# Patient Record
Sex: Male | Born: 1980 | Hispanic: Yes | Marital: Single | Smoking: Never smoker
Health system: Southern US, Community
[De-identification: ages and names within clinical notes are randomized; demographics above are authoritative.]

---

## 2016-01-20 ENCOUNTER — Emergency Department (HOSPITAL_BASED_OUTPATIENT_CLINIC_OR_DEPARTMENT_OTHER): Payer: Worker's Compensation

## 2016-01-20 ENCOUNTER — Emergency Department (HOSPITAL_BASED_OUTPATIENT_CLINIC_OR_DEPARTMENT_OTHER)
Admission: EM | Admit: 2016-01-20 | Discharge: 2016-01-20 | Disposition: A | Discharge status: Home / Self Care | Payer: Worker's Compensation | Attending: Emergency Medicine | Admitting: Emergency Medicine

## 2016-01-20 ENCOUNTER — Encounter (HOSPITAL_BASED_OUTPATIENT_CLINIC_OR_DEPARTMENT_OTHER): Payer: Self-pay

## 2016-01-20 DIAGNOSIS — X58XXXA Exposure to other specified factors, initial encounter: Secondary | ICD-10-CM | POA: Insufficient documentation

## 2016-01-20 DIAGNOSIS — S76219A Strain of adductor muscle, fascia and tendon of unspecified thigh, initial encounter: Secondary | ICD-10-CM

## 2016-01-20 DIAGNOSIS — S39011A Strain of muscle, fascia and tendon of abdomen, initial encounter: Secondary | ICD-10-CM | POA: Diagnosis not present

## 2016-01-20 DIAGNOSIS — Y939 Activity, unspecified: Secondary | ICD-10-CM | POA: Diagnosis not present

## 2016-01-20 DIAGNOSIS — Y929 Unspecified place or not applicable: Secondary | ICD-10-CM | POA: Insufficient documentation

## 2016-01-20 DIAGNOSIS — Y990 Civilian activity done for income or pay: Secondary | ICD-10-CM | POA: Insufficient documentation

## 2016-01-20 DIAGNOSIS — S3991XA Unspecified injury of abdomen, initial encounter: Secondary | ICD-10-CM | POA: Diagnosis present

## 2016-01-20 DIAGNOSIS — N50811 Right testicular pain: Secondary | ICD-10-CM

## 2016-01-20 LAB — URINE MICROSCOPIC-ADD ON: Squamous Epithelial / LPF: NONE SEEN

## 2016-01-20 LAB — URINALYSIS, ROUTINE W REFLEX MICROSCOPIC
BILIRUBIN URINE: NEGATIVE
GLUCOSE, UA: NEGATIVE mg/dL
KETONES UR: NEGATIVE mg/dL
LEUKOCYTES UA: NEGATIVE
Nitrite: NEGATIVE
PH: 6 (ref 5.0–8.0)
Protein, ur: NEGATIVE mg/dL
SPECIFIC GRAVITY, URINE: 1.009 (ref 1.005–1.030)

## 2016-01-20 MED ORDER — NAPROXEN 500 MG PO TABS: 500.0000 mg | ORAL_TABLET | Freq: Two times a day (BID) | ORAL | Status: AC

## 2016-01-20 MED ORDER — ONDANSETRON HCL 4 MG/2ML IJ SOLN
INTRAMUSCULAR | Status: AC
  Filled 2016-01-20: qty 2

## 2016-01-20 NOTE — ED Provider Notes (Signed)
CSN: [id number]     Arrival date & time 01/20/16  1759 History  By signing my name below, I, Freida Busman, attest that this documentation has been prepared under the direction and in the presence of Rolan Bucco, MD . Electronically Signed: Freida Busman, Scribe. 01/20/2016. 7:59 PM.  Chief Complaint  Patient presents with  . Groin Injury   The history is provided by the patient. No language interpreter was used.     HPI Comments:  Mitchell Christian is a 35 y.o. male who presents to the Emergency Department complaining of an injury to his groin which occurred 4 days ago. Pt states he sat down improperly on a frame and injured his right testicle. He reports moderate pain to the site. He also reports mild relief when in a squatted position. Pt notes associated lower abdominal pain x 3 days. He denies nausea, vomiting, dysuria, genital masses, and hematuria.  History reviewed. No pertinent past medical history. History reviewed. No pertinent past surgical history. No family history on file. Social History  Substance Use Topics  . Smoking status: Never Smoker   . Smokeless tobacco: None  . Alcohol Use: Yes     Comment: occ    Review of Systems  Gastrointestinal: Positive for abdominal pain. Negative for nausea and vomiting.  Genitourinary: Positive for testicular pain. Negative for dysuria and hematuria.  All other systems reviewed and are negative.  Allergies  Review of patient's allergies indicates no known allergies.  Home Medications   Prior to Admission medications   Medication Sig Start Date End Date Taking? Authorizing Provider  naproxen (NAPROSYN) 500 MG tablet Take 1 tablet (500 mg total) by mouth 2 (two) times daily. 01/20/16   Rolan Bucco, MD   BP 116/80 mmHg  Pulse 63  Temp(Src) 98.7 F (37.1 C) (Oral)  Resp 16  Ht 6' (1.829 m)  Wt 195 lb (88.451 kg)  BMI 26.44 kg/m2  SpO2 100% Physical Exam  Constitutional: He is oriented to person, place, and time. He  appears well-developed and well-nourished.  HENT:  Head: Normocephalic and atraumatic.  Eyes: EOM are normal.  Neck: Normal range of motion.  Cardiovascular: Normal rate, regular rhythm, normal heart sounds and intact distal pulses.   Pulmonary/Chest: Effort normal and breath sounds normal. No respiratory distress.  Abdominal: Soft. He exhibits no distension. There is no tenderness. There is no CVA tenderness.  Genitourinary: Penis normal. Uncircumcised.   Tenderness to left testicle  No swelling or abnormal lie  No hernia Chaperone (scribe) was present for exam which was performed with no discomfort or complications.   Musculoskeletal: Normal range of motion.  Neurological: He is alert and oriented to person, place, and time.  Skin: Skin is warm and dry.  Psychiatric: He has a normal mood and affect. Judgment normal.  Nursing note and vitals reviewed.   ED Course  Procedures DIAGNOSTIC STUDIES:  Oxygen Saturation is 98% on RA, normal by my interpretation.    COORDINATION OF CARE:  7:44 PM Will order scrotal US. Discussed treatment plan with pt at bedside and pt agreed to plan.  Labs Review  Results for orders placed or performed during the hospital encounter of 01/20/16  Urinalysis, Routine w reflex microscopic  Result Value Ref Range   Color, Urine YELLOW YELLOW   APPearance CLEAR CLEAR   Specific Gravity, Urine 1.009 1.005 - 1.030   pH 6.0 5.0 - 8.0   Glucose, UA NEGATIVE NEGATIVE mg/dL   Hgb urine dipstick TRACE (A) NEGATIVE  Bilirubin Urine NEGATIVE NEGATIVE   Ketones, ur NEGATIVE NEGATIVE mg/dL   Protein, ur NEGATIVE NEGATIVE mg/dL   Nitrite NEGATIVE NEGATIVE   Leukocytes, UA NEGATIVE NEGATIVE  Urine microscopic-add on  Result Value Ref Range   Squamous Epithelial / LPF NONE SEEN NONE SEEN   WBC, UA 0-5 0 - 5 WBC/hpf   RBC / HPF 0-5 0 - 5 RBC/hpf   Bacteria, UA RARE (A) NONE SEEN   [place] Scrotum  01/20/2016  CLINICAL DATA:  Injury to right testicle 4 days  ago. EXAM: SCROTAL ULTRASOUND DOPPLER ULTRASOUND OF THE TESTICLES TECHNIQUE: Complete ultrasound examination of the testicles, epididymis, and other scrotal structures was performed. Color and spectral Doppler ultrasound were also utilized to evaluate blood flow to the testicles. COMPARISON:  None. FINDINGS: Right testicle Measurements: 5 x 2.4 x 3.4 cm. No parenchymal mass or edema appreciated. No evidence of parenchymal laceration. Several microcalcifications. Left testicle Measurements: 4.9 x 2.2 x 3.3 cm. No mass or microlithiasis visualized. Right epididymis:  Within normal limits in size and appearance. Left epididymis: Tiny epididymal cyst measuring 3 x 2 mm. Otherwise within normal limits in size and appearance. Hydrocele:  None visualized. Varicocele:  None visualized. Pulsed Doppler interrogation of both testes demonstrates normal low resistance arterial and venous waveforms bilaterally. IMPRESSION: No acute findings. No testicular edema or laceration identified bilaterally. No hydrocele. Normal Doppler. Several microcalcifications within the right testicle. 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 Monthly testicular self-examination and annual physical exams are considered appropriate surveillance. If patient has other risk factors for testicular carcinoma, then referral to Urology should be considered. (Reference: DeCastro, et al.: A 5-Year Follow up Study of Asymptomatic Men with Testicular Microlithiasis. J Urol 2008; 179:1420-1423.) Electronically Signed   By: Bary RichardStan  Maynard M.D.   On: 01/20/2016 21:36   [place] Art/ven Flow Abd Pelv Doppler  01/20/2016  CLINICAL DATA:  Injury to right testicle 4 days ago. EXAM: SCROTAL ULTRASOUND DOPPLER ULTRASOUND OF THE TESTICLES TECHNIQUE: Complete ultrasound examination of the  testicles, epididymis, and other scrotal structures was performed. Color and spectral Doppler ultrasound were also utilized to evaluate blood flow to the testicles. COMPARISON:  None. FINDINGS: Right testicle Measurements: 5 x 2.4 x 3.4 cm. No parenchymal mass or edema appreciated. No evidence of parenchymal laceration. Several microcalcifications. Left testicle Measurements: 4.9 x 2.2 x 3.3 cm. No mass or microlithiasis visualized. Right epididymis:  Within normal limits in size and appearance. Left epididymis: Tiny epididymal cyst measuring 3 x 2 mm. Otherwise within normal limits in size and appearance. Hydrocele:  None visualized. Varicocele:  None visualized. Pulsed Doppler interrogation of both testes demonstrates normal low resistance arterial and venous waveforms bilaterally. IMPRESSION: No acute findings. No testicular edema or laceration identified bilaterally. No hydrocele. Normal Doppler. Several microcalcifications within the right testicle. 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 Monthly testicular self-examination and annual physical exams are considered appropriate surveillance. If patient has other risk factors for testicular carcinoma, then referral to Urology should be considered. (Reference: DeCastro, et al.: A 5-Year Follow up Study of Asymptomatic Men with Testicular Microlithiasis. J Urol 2008; 179:1420-1423.) Electronically Signed   By: Bary RichardStan  Maynard M.D.   On: 01/20/2016 21:36  I have personally reviewed and evaluated these images and lab results as part of my medical decision-making.    MDM   Final diagnoses:  Groin strain, initial encounter    Patient has no evidence of torsion or other abnormal finding on the ultrasound. There is some microcalcifications. Patient doesn't seem to have any  significant risk factors for testicular cancer. He was given information on testicular self exams. He was discharged home in good condition. He was encouraged to use a jock strap for support and was given prescription for Naprosyn for symptomatically. Term precautions were given. He was referred to [facility] if his symptoms are not improving.  I personally performed the services described in this documentation, which was scribed in my presence.  The recorded information has been reviewed and considered.    Rolan Bucco, MD 01/20/16 2340

## 2016-01-20 NOTE — ED Notes (Signed)
Pt reports injury to genitals at work Tuesday-NAD-steady gait

## 2018-04-03 IMAGING — US US ART/VEN ABD/PELV/SCROTUM DOPPLER LTD
1 series · 13 of 25 positions shown · non-contrast
Comparison: None.

CLINICAL DATA: Injury to right testicle 4 days ago.

EXAM:
SCROTAL ULTRASOUND
DOPPLER ULTRASOUND OF THE TESTICLES
TECHNIQUE: Complete ultrasound examination of the testicles, epididymis, and
other scrotal structures was performed. Color and spectral Doppler
ultrasound were also utilized to evaluate blood flow to the
testicles.

[Series 1: us art/ven abd/pelv/scrotum doppler ltd · 0.07mm/px · 13 of 42 slices shown]
[im 1/42]
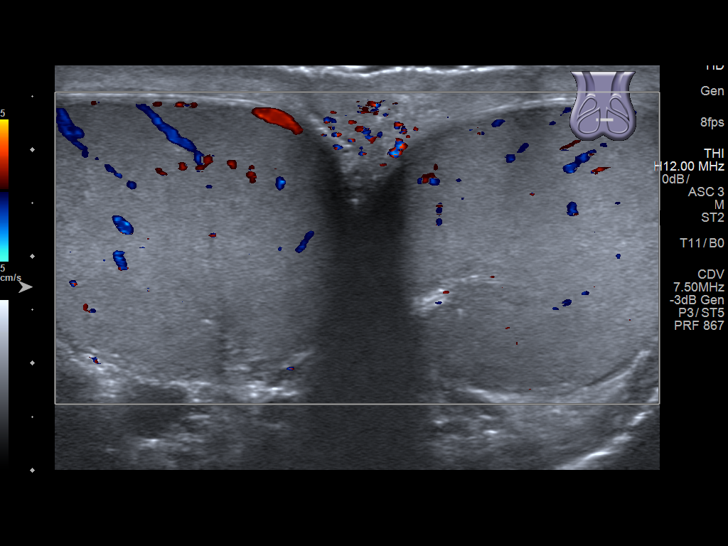
[im 4/42]
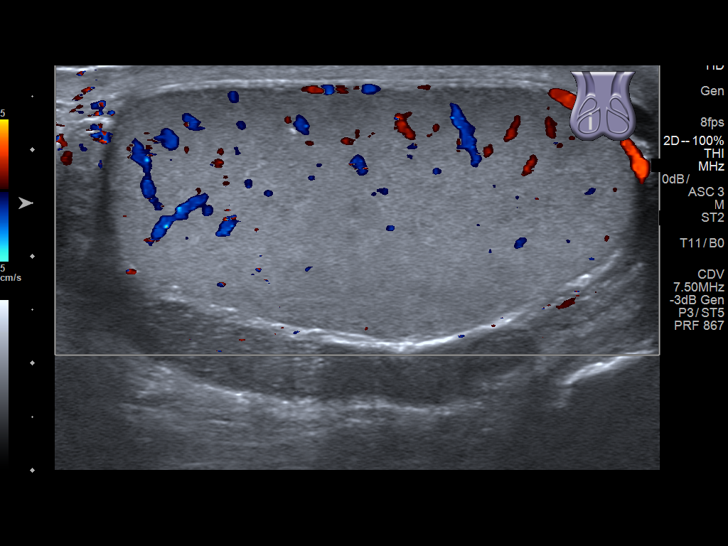
[im 7/42]
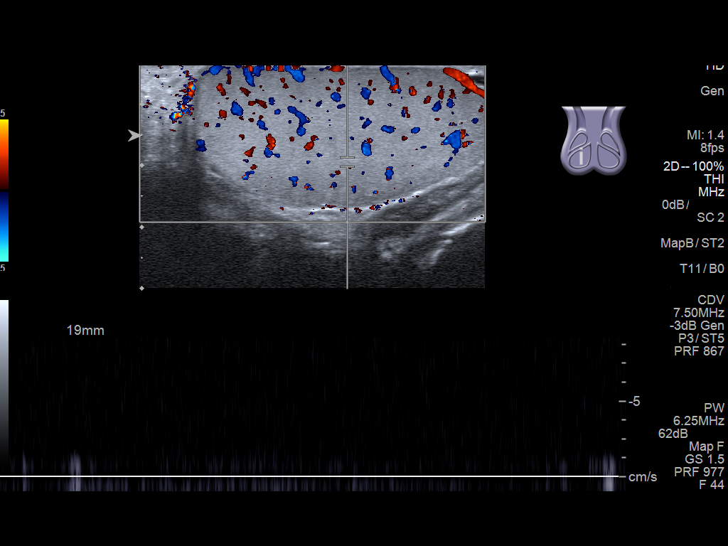
[im 11/42]
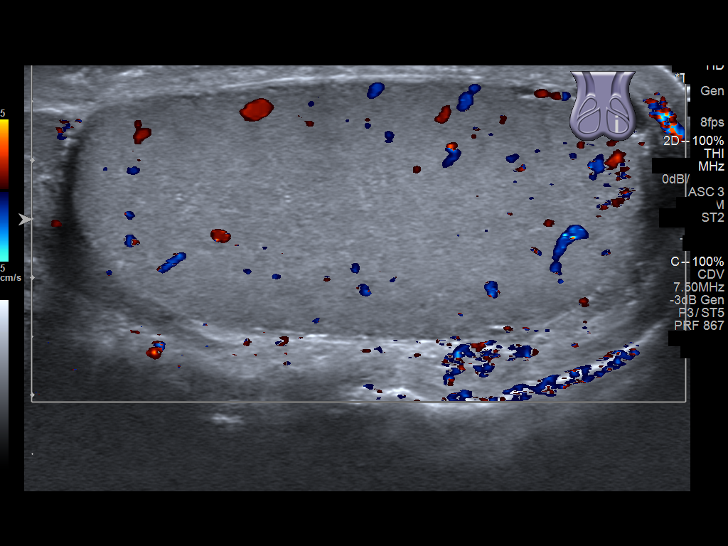
[im 14/42]
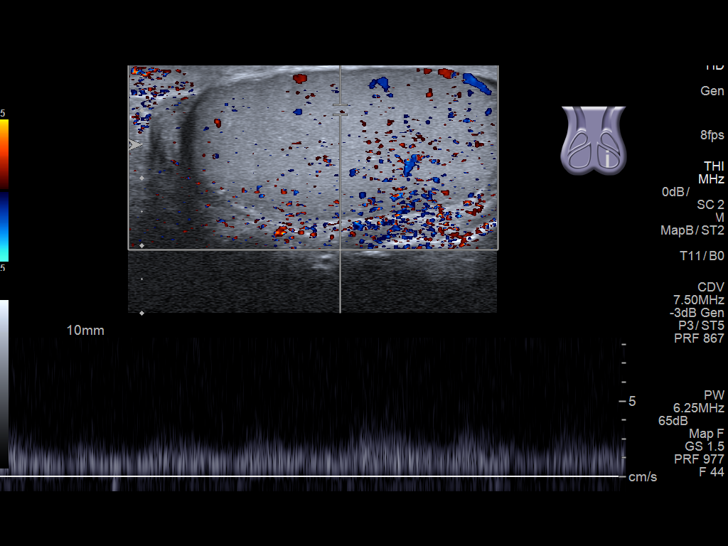
[im 18/42]
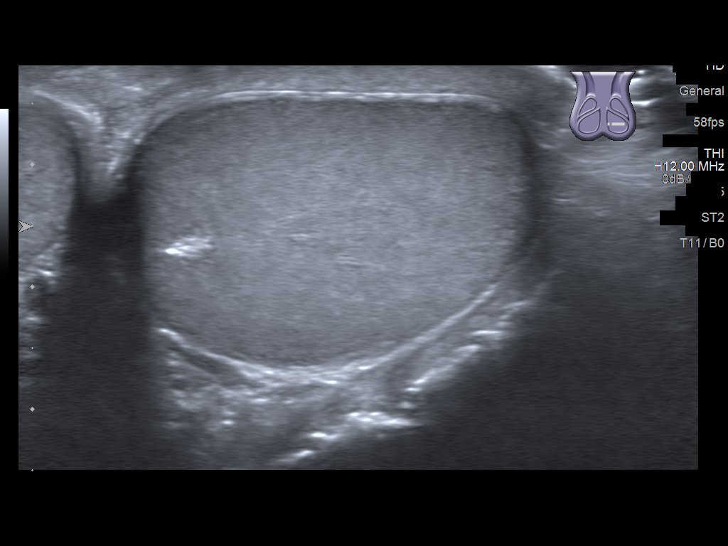
[im 21/42]
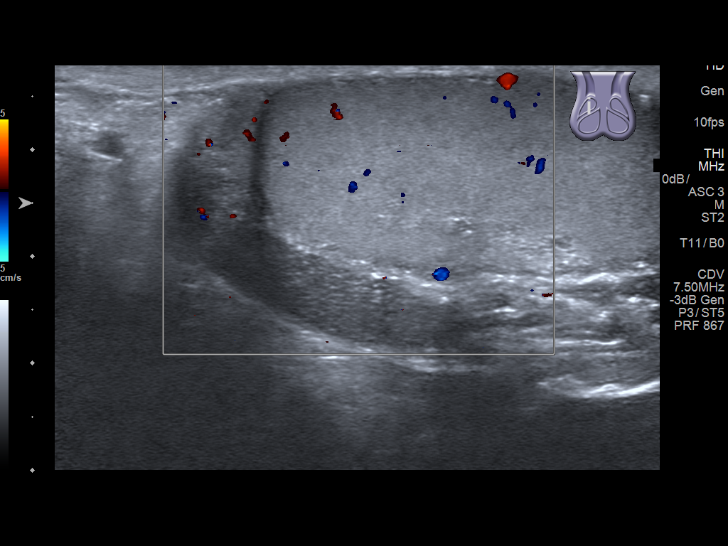
[im 24/42]
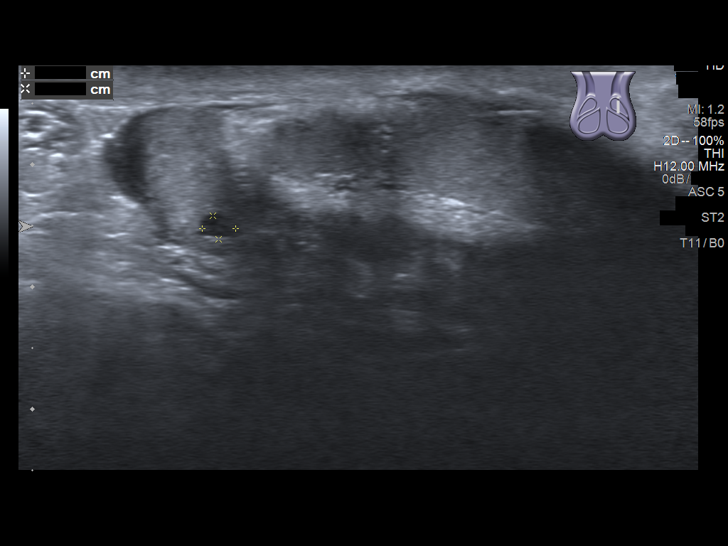
[im 28/42]
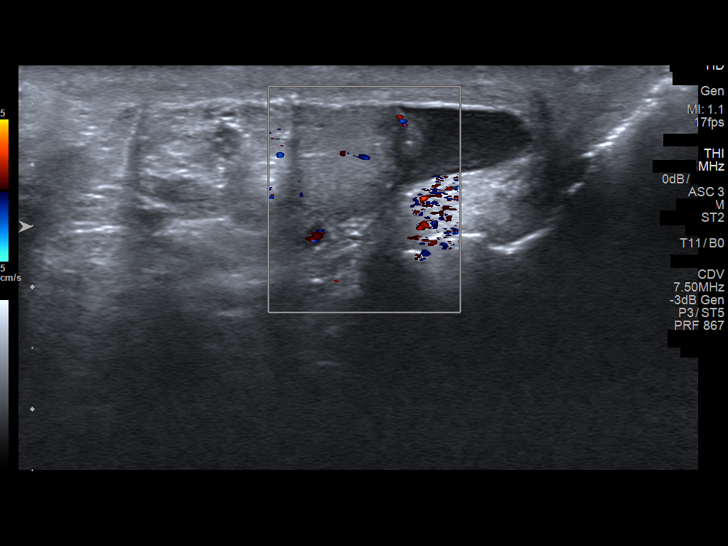
[im 31/42]
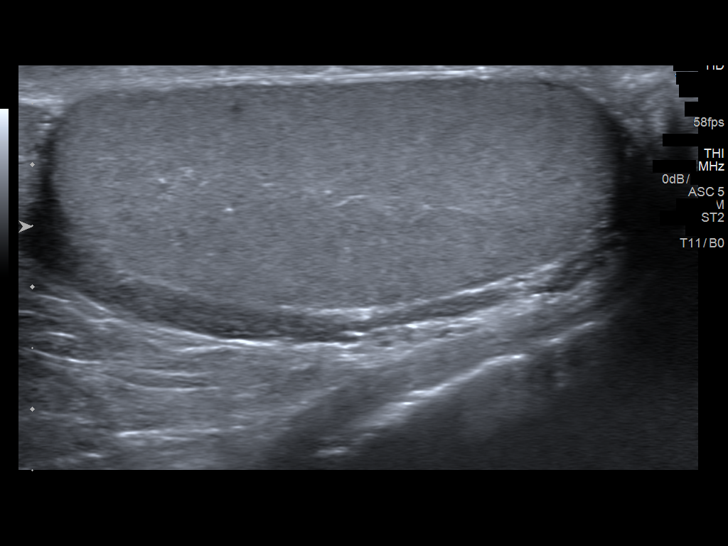
[im 35/42]
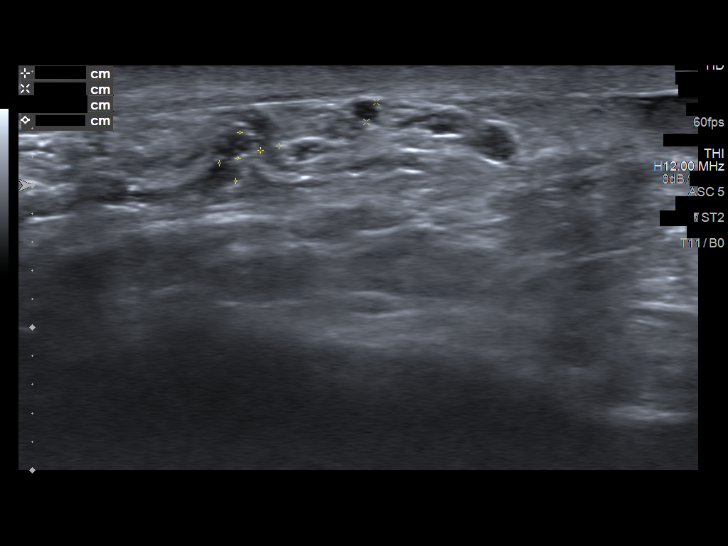
[im 38/42]
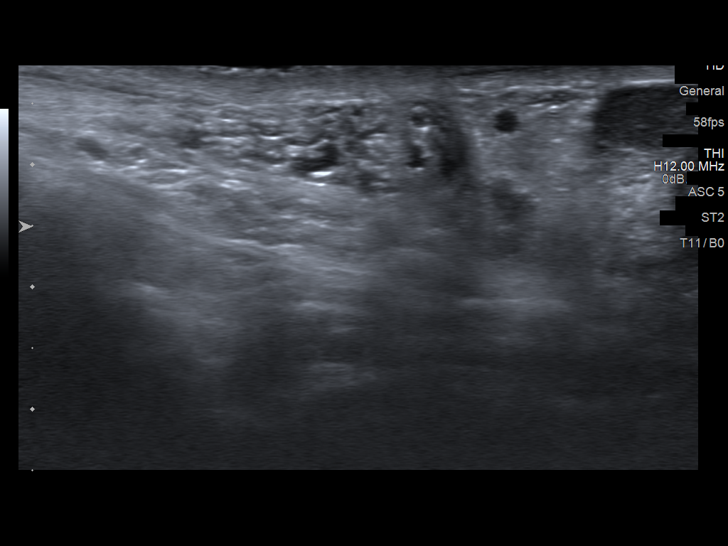
[im 42/42]
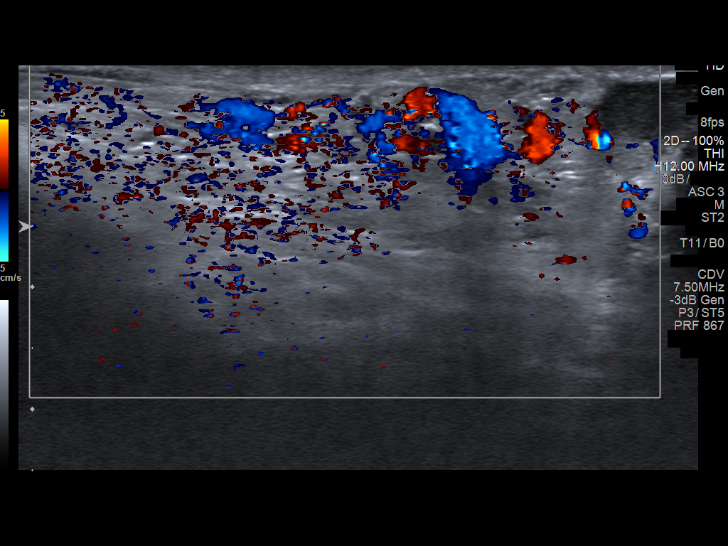

[13 of 25 positions shown; findings below may reference images not displayed]

FINDINGS: Right testicle

Measurements: 5 x 2.4 x 3.4 cm. No parenchymal mass or edema
appreciated. No evidence of parenchymal laceration. Several
microcalcifications.

Left testicle

Measurements: 4.9 x 2.2 x 3.3 cm. No mass or microlithiasis
visualized.

Right epididymis:  Within normal limits in size and appearance.

Left epididymis: Tiny epididymal cyst measuring 3 x 2 mm. Otherwise
within normal limits in size and appearance.

Hydrocele:  None visualized.

Varicocele:  None visualized.

Pulsed Doppler interrogation of both testes demonstrates normal low
resistance arterial and venous waveforms bilaterally.
IMPRESSION: No acute findings. No testicular edema or laceration identified
bilaterally. No hydrocele. Normal Doppler.

Several microcalcifications within the right testicle. Current
literature suggests that testicular microlithiasis is not a
significant independent risk factor for development of testicular
carcinoma, and that follow up imaging is not warranted in the
absence of other risk factors. Monthly testicular self-examination
and annual physical exams are considered appropriate surveillance.
If patient has other risk factors for testicular carcinoma, then
referral to Urology should be considered. (Reference: Lilien, et
al.: A 5-Year Follow up Study of Asymptomatic Men with Testicular
Microlithiasis. J Urol 5112; 179:2767-2767.)
